# Patient Record
Sex: Female | Born: 2000 | Race: White | Hispanic: No | Marital: Single | Smoking: Never smoker
Health system: Southern US, Community
[De-identification: ages and names within clinical notes are randomized; demographics above are authoritative.]

## PROBLEM LIST (undated history)

## (undated) DIAGNOSIS — G43909 Migraine, unspecified, not intractable, without status migrainosus: Secondary | ICD-10-CM

## (undated) DIAGNOSIS — J45909 Unspecified asthma, uncomplicated: Secondary | ICD-10-CM

## (undated) HISTORY — PX: TYMPANOSTOMY TUBE PLACEMENT: SHX32

## (undated) HISTORY — DX: Unspecified asthma, uncomplicated: J45.909

---
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
## 2020-06-16 ENCOUNTER — Other Ambulatory Visit: Payer: Self-pay

## 2020-06-16 ENCOUNTER — Emergency Department
Admission: EM | Admit: 2020-06-16 | Discharge: 2020-06-16 | Discharge status: Against Medical Advice | Payer: Self-pay | Attending: Emergency Medicine | Admitting: Emergency Medicine

## 2020-06-16 DIAGNOSIS — R202 Paresthesia of skin: Secondary | ICD-10-CM | POA: Insufficient documentation

## 2020-06-16 DIAGNOSIS — R42 Dizziness and giddiness: Secondary | ICD-10-CM | POA: Insufficient documentation

## 2020-06-16 LAB — URINALYSIS, ROUTINE W REFLEX MICROSCOPIC
Bilirubin Urine: NEGATIVE
Glucose, UA: NEGATIVE mg/dL
Hgb urine dipstick: NEGATIVE
Ketones, ur: NEGATIVE mg/dL
Nitrite: POSITIVE — AB
Protein, ur: NEGATIVE mg/dL
Specific Gravity, Urine: 1.024 (ref 1.005–1.030)
pH: 5 (ref 5.0–8.0)

## 2020-06-16 LAB — PREGNANCY, URINE: Preg Test, Ur: NEGATIVE

## 2020-06-16 NOTE — ED Triage Notes (Signed)
Pt states that starting a couple days ago she begin getting a periodic "shock like sensation" behind her right ear, states that it will cause her to feel dizzy briefly when it happens, states that she has a hx of migraines but states that this has never happened before, pt states that currently she feels a migraine starting. Pt states periodically the tingling sensation would radiate into her right arm No distress noted at this time

## 2020-06-19 LAB — POCT PREGNANCY, URINE: Preg Test, Ur: NEGATIVE

## 2020-08-19 ENCOUNTER — Other Ambulatory Visit: Payer: Self-pay

## 2020-08-19 ENCOUNTER — Ambulatory Visit
Admission: EM | Admit: 2020-08-19 | Discharge: 2020-08-19 | Disposition: A | Discharge status: Home / Self Care | Payer: BLUE CROSS/BLUE SHIELD | Attending: Emergency Medicine | Admitting: Emergency Medicine

## 2020-08-19 ENCOUNTER — Encounter: Payer: Self-pay | Admitting: Emergency Medicine

## 2020-08-19 DIAGNOSIS — H1032 Unspecified acute conjunctivitis, left eye: Secondary | ICD-10-CM

## 2020-08-19 MED ORDER — OFLOXACIN 0.3 % OP SOLN: 1.0000 [drp] | Freq: Four times a day (QID) | OPHTHALMIC | 0 refills | Status: DC

## 2020-08-19 NOTE — Discharge Instructions (Signed)
Use eye drops as prescribed and to completion Dispose of old contacts and wear glasses until you have finished course of antibiotic eye drops Wash pillow cases, wash hands regularly with soap and water, avoid touching your face and eyes, wash door handles, light switches, remotes and other objects you frequently touch Return or follow up with PCP if symptoms persists such as fever, chills, redness, swelling, eye pain, painful eye movements, vision changes, etc... 

## 2020-08-19 NOTE — ED Provider Notes (Signed)
[facility]   [id number] 08/19/20 Arrival Time: 1510  CC: Red eye  SUBJECTIVE:  Barbara Patrick is a 19 y.o. female presented to the urgent care with a complaint of left eye redness and itching that started yesterday.  Denies a precipitating event, trauma, or close contacts with similar symptoms.  Has tried OTC eye drops without relief.  Denies any aggravating factors.  Denies similar symptoms in the past.  Denies fever, chills, nausea, vomiting, eye pain, painful eye movements, halos, discharge, itching, vision changes, double vision, FB sensation, periorbital erythema.     Denies contact lens use.    ROS: As per HPI.  All other pertinent ROS negative.     Past Medical History:  Diagnosis Date   Migraine    Past Surgical History:  Procedure Laterality Date   TYMPANOSTOMY TUBE PLACEMENT     Allergies  Allergen Reactions   Toradol [Ketorolac Tromethamine]    No current facility-administered medications on file prior to encounter.   Current Outpatient Medications on File Prior to Encounter  Medication Sig Dispense Refill   ibuprofen (ADVIL,MOTRIN) 600 MG tablet Take 1 tablet (600 mg total) by mouth every 6 (six) hours as needed. (Patient not taking: Reported on 03/26/2017) 30 tablet 0   LORazepam (ATIVAN) 1 MG tablet Take 0.5 tablets (0.5 mg total) by mouth at bedtime as needed for anxiety. 5 tablet 0   metoCLOPramide (REGLAN) 10 MG tablet Take 1 tablet (10 mg total) by mouth every 8 (eight) hours as needed for nausea or vomiting. 30 tablet 0   prochlorperazine (COMPAZINE) 10 MG tablet Take 1 tablet (10 mg total) by mouth every 6 (six) hours as needed for nausea. 20 tablet 0   SUMAtriptan (IMITREX) 100 MG tablet Take 50-100 mg by mouth daily as needed. TAKE 1/2 - 1 TABLET BY MOUTH MAY REPEAT AFTER 2 HOURS IF NEEDED NO MORE THEN 1 TAB PER 24 HOURS  6   topiramate (TOPAMAX) 25 MG tablet Take 25-100 mg by mouth daily. TAKE BY MOUTH AS DIRECTED. 1 TABLET X 3 DAYS, 2  TABLETS X 3 DAYS, 3 TABLETS X 3 DAYS, THEN 4 DAILY  2   Social History   Socioeconomic History   Marital status: Single    Spouse name: Not on file   Number of children: Not on file   Years of education: Not on file   Highest education level: Not on file  Occupational History   Not on file  Tobacco Use   Smoking status: Never Smoker   Smokeless tobacco: Never Used  Substance and Sexual Activity   Alcohol use: No   Drug use: Not on file   Sexual activity: Not on file  Other Topics Concern   Not on file  Social History Narrative   Not on file   Social Determinants of Health   Financial Resource Strain:    Difficulty of Paying Living Expenses: Not on file  Food Insecurity:    Worried About Running Out of Food in the Last Year: Not on file   Ran Out of Food in the Last Year: Not on file  Transportation Needs:    Lack of Transportation (Medical): Not on file   Lack of Transportation (Non-Medical): Not on file  Physical Activity:    Days of Exercise per Week: Not on file   Minutes of Exercise per Session: Not on file  Stress:    Feeling of Stress : Not on file  Social Connections:    Frequency of  Communication with Friends and Family: Not on file   Frequency of Social Gatherings with Friends and Family: Not on file   Attends Religious Services: Not on file   Active Member of Clubs or Organizations: Not on file   Attends Banker Meetings: Not on file   Marital Status: Not on file  Intimate Partner Violence:    Fear of Current or Ex-Partner: Not on file   Emotionally Abused: Not on file   Physically Abused: Not on file   Sexually Abused: Not on file   No family history on file.  OBJECTIVE:    Visual Acuity  Right Eye Distance:   Left Eye Distance:   Bilateral Distance:    Right Eye Near:   Left Eye Near:    Bilateral Near:      Vitals:   08/19/20 1539  BP: 122/75  Pulse: 81  Resp: 16  Temp: 98.2 F (36.8 C)    TempSrc: Oral  SpO2: 96%    Physical Exam Vitals and nursing note reviewed.  Constitutional:      General: She is not in acute distress.    Appearance: Normal appearance. She is normal weight. She is not ill-appearing, toxic-appearing or diaphoretic.  HENT:     Head: Normocephalic.  Eyes:     General: Lids are normal. Lids are everted, no foreign bodies appreciated. Vision grossly intact.        Right eye: No foreign body, discharge or hordeolum.        Left eye: Discharge present.No foreign body or hordeolum.     Comments: Left eye redness present  Cardiovascular:     Rate and Rhythm: Normal rate and regular rhythm.     Pulses: Normal pulses.     Heart sounds: Normal heart sounds. No murmur heard.  No friction rub. No gallop.   Pulmonary:     Effort: Pulmonary effort is normal. No respiratory distress.     Breath sounds: Normal breath sounds. No stridor. No wheezing, rhonchi or rales.  Chest:     Chest wall: No tenderness.  Neurological:     Mental Status: She is alert and oriented to person, place, and time.      ASSESSMENT & PLAN:  1. Acute bacterial conjunctivitis of left eye     Meds ordered this encounter  Medications   ofloxacin (OCUFLOX) 0.3 % ophthalmic solution    Sig: Place 1 drop into the left eye 4 (four) times daily.    Dispense:  5 mL    Refill:  0     Discharge Instructions Use eye drops as prescribed and to completion Dispose of old contacts and wear glasses until you have finished course of antibiotic eye drops Wash pillow cases, wash hands regularly with soap and water, avoid touching your face and eyes, wash door handles, light switches, remotes and other objects you frequently touch Return or follow up with PCP if symptoms persists such as fever, chills, redness, swelling, eye pain, painful eye movements, vision changes, etc...   Reviewed expectations re: course of current medical issues. Questions answered. Outlined signs and symptoms  indicating need for more acute intervention. Patient verbalized understanding. After Visit Summary given.   Note: This document was prepared using Dragon voice recognition software and may include unintentional dictation errors.    Durward Parcel, FNP 08/19/20 1621

## 2020-08-19 NOTE — ED Triage Notes (Signed)
left eye redness with itching. pt states she rubbed her eye last night and its been red and watery every since.

## 2023-09-26 ENCOUNTER — Other Ambulatory Visit: Payer: Self-pay | Admitting: Student

## 2023-09-26 DIAGNOSIS — R531 Weakness: Secondary | ICD-10-CM

## 2023-09-26 DIAGNOSIS — H53149 Visual discomfort, unspecified: Secondary | ICD-10-CM

## 2023-09-26 DIAGNOSIS — G43E09 Chronic migraine with aura, not intractable, without status migrainosus: Secondary | ICD-10-CM

## 2023-09-26 DIAGNOSIS — G8929 Other chronic pain: Secondary | ICD-10-CM

## 2023-09-30 ENCOUNTER — Other Ambulatory Visit: Payer: Self-pay | Admitting: Student

## 2023-09-30 DIAGNOSIS — R531 Weakness: Secondary | ICD-10-CM

## 2023-09-30 DIAGNOSIS — G8929 Other chronic pain: Secondary | ICD-10-CM

## 2023-10-16 ENCOUNTER — Ambulatory Visit
Admission: RE | Admit: 2023-10-16 | Discharge: 2023-10-16 | Disposition: A | Discharge status: Home / Self Care | Payer: Commercial Managed Care - PPO | Source: Ambulatory Visit | Attending: Student | Admitting: Student

## 2023-10-16 DIAGNOSIS — R531 Weakness: Secondary | ICD-10-CM

## 2023-10-16 DIAGNOSIS — G43E09 Chronic migraine with aura, not intractable, without status migrainosus: Secondary | ICD-10-CM

## 2023-10-16 DIAGNOSIS — H53149 Visual discomfort, unspecified: Secondary | ICD-10-CM

## 2023-10-16 DIAGNOSIS — G8929 Other chronic pain: Secondary | ICD-10-CM

## 2023-11-23 ENCOUNTER — Other Ambulatory Visit: Payer: Self-pay

## 2023-11-23 ENCOUNTER — Emergency Department
Admission: EM | Admit: 2023-11-23 | Discharge: 2023-11-23 | Disposition: A | Discharge status: Home / Self Care | Payer: Commercial Managed Care - PPO | Attending: Emergency Medicine | Admitting: Emergency Medicine

## 2023-11-23 ENCOUNTER — Emergency Department: Payer: Commercial Managed Care - PPO

## 2023-11-23 DIAGNOSIS — R519 Headache, unspecified: Secondary | ICD-10-CM | POA: Diagnosis present

## 2023-11-23 DIAGNOSIS — G43009 Migraine without aura, not intractable, without status migrainosus: Secondary | ICD-10-CM | POA: Insufficient documentation

## 2023-11-23 DIAGNOSIS — R079 Chest pain, unspecified: Secondary | ICD-10-CM | POA: Diagnosis not present

## 2023-11-23 LAB — TROPONIN I (HIGH SENSITIVITY): Troponin I (High Sensitivity): <2 ng/L (ref <18)

## 2023-11-23 LAB — BASIC METABOLIC PANEL
Anion gap: 8 (ref 5–15)
BUN: 11 mg/dL (ref 6–20)
CO2: 21 mmol/L — ABNORMAL LOW (ref 22–32)
Calcium: 8.3 mg/dL — ABNORMAL LOW (ref 8.9–10.3)
Chloride: 108 mmol/L (ref 98–111)
Creatinine, Ser: 0.7 mg/dL (ref 0.44–1.00)
GFR, Estimated: >60 mL/min (ref >60)
Glucose, Bld: 99 mg/dL (ref 70–99)
Potassium: 3.5 mmol/L (ref 3.5–5.1)
Sodium: 137 mmol/L (ref 135–145)

## 2023-11-23 LAB — CBC
HCT: 35.2 % — ABNORMAL LOW (ref 36.0–46.0)
Hemoglobin: 12.2 g/dL (ref 12.0–15.0)
MCH: 30.5 pg (ref 26.0–34.0)
MCHC: 34.7 g/dL (ref 30.0–36.0)
MCV: 88 fL (ref 80.0–100.0)
Platelets: 214 10*3/uL (ref 150–400)
RBC: 4 MIL/uL (ref 3.87–5.11)
RDW: 12.5 % (ref 11.5–15.5)
WBC: 6.1 10*3/uL (ref 4.0–10.5)
nRBC: 0 % (ref 0.0–0.2)

## 2023-11-23 MED ORDER — DIPHENHYDRAMINE HCL 50 MG/ML IJ SOLN
25.0000 mg | Freq: Once | INTRAMUSCULAR | Status: AC
  Administered 2023-11-23: 25 mg via INTRAVENOUS
  Filled 2023-11-23: qty 1

## 2023-11-23 MED ORDER — PROCHLORPERAZINE EDISYLATE 10 MG/2ML IJ SOLN
10.0000 mg | Freq: Once | INTRAMUSCULAR | Status: AC
  Administered 2023-11-23: 10 mg via INTRAVENOUS
  Filled 2023-11-23: qty 2

## 2023-11-23 MED ORDER — DEXAMETHASONE SODIUM PHOSPHATE 10 MG/ML IJ SOLN
10.0000 mg | Freq: Once | INTRAMUSCULAR | Status: AC
  Administered 2023-11-23: 10 mg via INTRAVENOUS
  Filled 2023-11-23: qty 1

## 2023-11-23 MED ORDER — SODIUM CHLORIDE 0.9 % IV BOLUS
500.0000 mL | Freq: Once | INTRAVENOUS | Status: AC
  Administered 2023-11-23: 500 mL via INTRAVENOUS

## 2023-11-23 NOTE — ED Triage Notes (Signed)
Pt to ED via POV from home. Pt reports was having a migraine and took medication 1hr PTA and is now having center/right sided chest tightness, nausea, SOB and right jaw pain. Pt reports has had the migraine medication before with no issues. Pt is also on propanolol.

## 2023-11-23 NOTE — Discharge Instructions (Addendum)
Your evaluated in the ED today for chest pain and a migraine.  The test performed today EKG, chest x-ray and blood work are all reassuring.  Please follow up with your primary neurologist to discuss Ubrelvy medication.

## 2023-11-23 NOTE — ED Provider Notes (Addendum)
[facility] Emergency Department Provider Note     Event Date/Time   First MD Initiated Contact with Patient 11/23/23 1453     (approximate)   History   Chest Pain   HPI  Barbara Patrick is a 22 y.o. female with a history of chronic migraines presents to the ED for evaluation of headache and centralized chest pain with radiation to her right arm following taking a new medication for her migraines, Ubrelvy, 1 hour prior to ED arrival.  Patient reports she has tried this medication prior to today and had no side effects.  Prescription prescribed by her neurologist.  Associated symptoms includes nausea, shortness of breath and jaw pain.  Patient reports she has never experienced similar symptoms before.     Physical Exam   Triage Vital Signs: ED Triage Vitals  Encounter Vitals Group     BP 11/23/23 1444 133/79     Systolic BP Percentile --      Diastolic BP Percentile --      Pulse Rate 11/23/23 1443 90     Resp 11/23/23 1443 20     Temp 11/23/23 1443 98.1 F (36.7 C)     Temp Source 11/23/23 1443 Oral     SpO2 11/23/23 1444 98 %     Weight --      Height --      Head Circumference --      Peak Flow --      Pain Score 11/23/23 1441 0     Pain Loc --      Pain Education --      Exclude from Growth Chart --     Most recent vital signs: Vitals:   11/23/23 1443 11/23/23 1444  BP:  133/79  Pulse: 90   Resp: 20   Temp: 98.1 F (36.7 C)   SpO2:  98%    General: Alert and oriented. INAD.    Head:  NCAT.  Eyes:  PERRLA. EOMI.  CV:  Good peripheral perfusion. RRR. No peripheral edema.  RESP:  Normal effort. LCTAB. No retractions.  No wheezing or rales or rhonchi. ABD:  No distention. Soft, Non tender.  MSK:   Full ROM in all joints. No swelling, deformity or tenderness.  NEURO: Cranial nerves II-XII intact. No focal deficits. Sensation and motor function intact. 5/5 muscle strength of UE & LE. Gait is steady.    ED Results / Procedures /  Treatments   Labs (all labs ordered are listed, but only abnormal results are displayed) Labs Reviewed  CBC - Abnormal; Notable for the following components:      Result Value   HCT 35.2 (*)    All other components within normal limits  BASIC METABOLIC PANEL - Abnormal; Notable for the following components:   CO2 21 (*)    Calcium 8.3 (*)    All other components within normal limits    EKG  Normal sinus rhythm with sinus arrhythmia.  Unchanged since last EKG.  RADIOLOGY  I personally viewed and evaluated these images as part of my medical decision making, as well as reviewing the written report by the radiologist.  ED Provider Interpretation: Chest x-ray is unremarkable.  DG Chest 2 View  Result Date: 11/23/2023 CLINICAL DATA:  Central chest pain and shortness of breath. EXAM: CHEST - 2 VIEW COMPARISON:  07/31/2016 FINDINGS: The heart size and mediastinal contours are within normal limits. Both lungs are clear. The visualized skeletal structures are unremarkable. IMPRESSION: Normal  exam. Electronically Signed   By: Danae Orleans M.D.   On: 11/23/2023 15:14    PROCEDURES:  Critical Care performed: No  Procedures   MEDICATIONS ORDERED IN ED: Medications  diphenhydrAMINE (BENADRYL) injection 25 mg (25 mg Intravenous Given 11/23/23 1534)  dexamethasone (DECADRON) injection 10 mg (10 mg Intravenous Given 11/23/23 1534)  prochlorperazine (COMPAZINE) injection 10 mg (10 mg Intravenous Given 11/23/23 1534)  sodium chloride 0.9 % bolus 500 mL (500 mLs Intravenous New Bag/Given 11/23/23 1534)     IMPRESSION / MDM / ASSESSMENT AND PLAN / ED COURSE  I reviewed the triage vital signs and the nursing notes.                               22 y.o. female presents to the emergency department for evaluation and treatment of migraine and chest pain. See HPI for further details.   Differential diagnosis includes, but is not limited to medication reaction, migraine, PE,  costochondritis  Patient's presentation is most consistent with acute complicated illness / injury requiring diagnostic workup.  Patient is alert and oriented.  She is hemodynamically stable and in no acute distress.  Physical exam findings does not reveal any unilateral leg swelling and normal lung exam and my suspicion for PE low  Wells criteria utilized."Low risk group: 1.3% chance of PE in an ED population."  No indication of PE, highly unlikely.  No further workup needed.  Migraine cocktail administered in the ED with Decadron, Benadryl and Compazine with 500 mL IV fluids.   Chart reviewed-office visit on 10/29 patient started on Ubrelvy as needed for headache rescue.  MRI of brain performed on 10/24 that is unremarkable.  Labs are reassuring.  EKG is normal.  Chest x-ray is normal.  Patient is encouraged to follow-up closely with her neurologist next week for evaluation of her new medication Ubrelvy.   The patient is in stable condition for discharge home. Encouraged close follow up with Dr. Malvin Johns Neurosurgery for further management. ED precautions discussed. All questions and concerns were addressed during this ED visit.     FINAL CLINICAL IMPRESSION(S) / ED DIAGNOSES   Final diagnoses:  Nonspecific chest pain  Migraine without aura and without status migrainosus, not intractable   Rx / DC Orders   ED Discharge Orders     None        Note:  This document was prepared using Dragon voice recognition software and may include unintentional dictation errors.    Kern Reap A, PA-C 11/23/23 1653    Romeo Apple, Paytience Bures A, PA-C 11/23/23 1655    Minna Antis, MD 11/23/23 [phone]

## 2024-01-02 ENCOUNTER — Inpatient Hospital Stay: Payer: Commercial Managed Care - PPO | Attending: Internal Medicine | Admitting: Internal Medicine

## 2024-01-02 ENCOUNTER — Encounter: Payer: Self-pay | Admitting: Internal Medicine

## 2024-01-02 ENCOUNTER — Inpatient Hospital Stay: Payer: Commercial Managed Care - PPO

## 2024-01-02 VITALS — BP 129/74 | HR 104 | Temp 97.4°F | Ht 67.0 in | Wt 172.2 lb

## 2024-01-02 DIAGNOSIS — M51369 Other intervertebral disc degeneration, lumbar region without mention of lumbar back pain or lower extremity pain: Secondary | ICD-10-CM | POA: Insufficient documentation

## 2024-01-02 DIAGNOSIS — R233 Spontaneous ecchymoses: Secondary | ICD-10-CM | POA: Diagnosis not present

## 2024-01-02 DIAGNOSIS — M4317 Spondylolisthesis, lumbosacral region: Secondary | ICD-10-CM | POA: Insufficient documentation

## 2024-01-02 DIAGNOSIS — G43909 Migraine, unspecified, not intractable, without status migrainosus: Secondary | ICD-10-CM | POA: Diagnosis not present

## 2024-01-02 DIAGNOSIS — Z79899 Other long term (current) drug therapy: Secondary | ICD-10-CM | POA: Insufficient documentation

## 2024-01-02 DIAGNOSIS — M47816 Spondylosis without myelopathy or radiculopathy, lumbar region: Secondary | ICD-10-CM | POA: Insufficient documentation

## 2024-01-02 DIAGNOSIS — R9389 Abnormal findings on diagnostic imaging of other specified body structures: Secondary | ICD-10-CM | POA: Insufficient documentation

## 2024-01-02 DIAGNOSIS — M48061 Spinal stenosis, lumbar region without neurogenic claudication: Secondary | ICD-10-CM | POA: Insufficient documentation

## 2024-01-02 DIAGNOSIS — G8929 Other chronic pain: Secondary | ICD-10-CM | POA: Insufficient documentation

## 2024-01-02 DIAGNOSIS — R937 Abnormal findings on diagnostic imaging of other parts of musculoskeletal system: Secondary | ICD-10-CM | POA: Insufficient documentation

## 2024-01-02 DIAGNOSIS — R519 Headache, unspecified: Secondary | ICD-10-CM | POA: Diagnosis not present

## 2024-01-02 DIAGNOSIS — M47812 Spondylosis without myelopathy or radiculopathy, cervical region: Secondary | ICD-10-CM | POA: Diagnosis not present

## 2024-01-02 DIAGNOSIS — Z8249 Family history of ischemic heart disease and other diseases of the circulatory system: Secondary | ICD-10-CM | POA: Insufficient documentation

## 2024-01-02 DIAGNOSIS — R5383 Other fatigue: Secondary | ICD-10-CM | POA: Insufficient documentation

## 2024-01-02 DIAGNOSIS — J45909 Unspecified asthma, uncomplicated: Secondary | ICD-10-CM | POA: Diagnosis not present

## 2024-01-02 LAB — CBC WITH DIFFERENTIAL (CANCER CENTER ONLY)
Abs Immature Granulocytes: 0.01 10*3/uL (ref 0.00–0.07)
Basophils Absolute: 0.1 10*3/uL (ref 0.0–0.1)
Basophils Relative: 1 %
Eosinophils Absolute: 0 10*3/uL (ref 0.0–0.5)
Eosinophils Relative: 1 %
HCT: 39.9 % (ref 36.0–46.0)
Hemoglobin: 13.6 g/dL (ref 12.0–15.0)
Immature Granulocytes: 0 %
Lymphocytes Relative: 20 %
Lymphs Abs: 1.3 10*3/uL (ref 0.7–4.0)
MCH: 30.6 pg (ref 26.0–34.0)
MCHC: 34.1 g/dL (ref 30.0–36.0)
MCV: 89.7 fL (ref 80.0–100.0)
Monocytes Absolute: 0.4 10*3/uL (ref 0.1–1.0)
Monocytes Relative: 7 %
Neutro Abs: 4.5 10*3/uL (ref 1.7–7.7)
Neutrophils Relative %: 71 %
Platelet Count: 265 10*3/uL (ref 150–400)
RBC: 4.45 MIL/uL (ref 3.87–5.11)
RDW: 12.4 % (ref 11.5–15.5)
WBC Count: 6.3 10*3/uL (ref 4.0–10.5)
nRBC: 0 % (ref 0.0–0.2)

## 2024-01-02 LAB — CMP (CANCER CENTER ONLY)
ALT: 16 U/L (ref 0–44)
AST: 14 U/L — ABNORMAL LOW (ref 15–41)
Albumin: 4.5 g/dL (ref 3.5–5.0)
Alkaline Phosphatase: 99 U/L (ref 38–126)
Anion gap: 8 (ref 5–15)
BUN: 16 mg/dL (ref 6–20)
CO2: 24 mmol/L (ref 22–32)
Calcium: 9.2 mg/dL (ref 8.9–10.3)
Chloride: 105 mmol/L (ref 98–111)
Creatinine: 0.92 mg/dL (ref 0.44–1.00)
GFR, Estimated: >60 mL/min (ref >60)
Glucose, Bld: 96 mg/dL (ref 70–99)
Potassium: 3.7 mmol/L (ref 3.5–5.1)
Sodium: 137 mmol/L (ref 135–145)
Total Bilirubin: 1 mg/dL (ref 0.0–1.2)
Total Protein: 7.9 g/dL (ref 6.5–8.1)

## 2024-01-02 LAB — C-REACTIVE PROTEIN: CRP: <0.5 mg/dL (ref <1.0)

## 2024-01-02 LAB — LACTATE DEHYDROGENASE: LDH: 107 U/L (ref 98–192)

## 2024-01-02 NOTE — Assessment & Plan Note (Addendum)
#   October 2024 -lumbosacral spine and cervical spine MRI -incidental finding of diffuse abnormal T1 hypointense marrow signal within the cervical and visualized upper thoracic spine. While this finding can reflect a marrow infiltrative process, the most common causes include chronic anemia, smoking and obesity.  # Clinically low concern for any malignancy.  However patient has nonspecific symptoms of fatigue.  Patient does not have anemia, not obese-also does not smoke to explain her findings. Today I would recommend repeating labs including cbc/cmp; LDH; CRP; PT/PTT.  Will also review imaging at tumor conference next week.   # Easy bruising: No medications.  Check PT PTT.  # Migraines [Dr.Potter]- defer to neurology.   Thank you Dr.Potter for allowing me to participate in the care of your pleasant patient. Please do not hesitate to contact me with questions or concerns in the interim.  # DISPOSITION: # labs today-  # follow up in 1 week/ next Friday at 2- MD; no labs- Dr.B  # I reviewed the blood work- with the patient in detail; also reviewed the imaging independently [as summarized above]; and with the patient in detail.

## 2024-01-02 NOTE — Progress Notes (Signed)
 [facility] CONSULT NOTE  Patient Care Team: Isidro Butler POUR, MD as PCP - General (Pediatrics) Rennie Cindy SAUNDERS, MD as Consulting Physician (Internal Medicine)  CHIEF COMPLAINTS/PURPOSE OF CONSULTATION: Abnormal imaging.  Oncology History   No history exists.    Narrative & Impression  CLINICAL DATA:  Provided history: Left-sided weakness. Chronic neck and back pain. Additional history provided by the scanning technologist: The patient reports left-sided weakness.   EXAM: MRI LUMBAR SPINE WITHOUT CONTRAST   TECHNIQUE: Multiplanar, multisequence MR imaging of the lumbar spine was performed. No intravenous contrast was administered.   COMPARISON:  Same-day MRI examinations of the brain and cervical spine.   FINDINGS: Segmentation: Transitional lumbosacral anatomy. For the purposes of this dictation, the transitional vertebra is designated S1. By this numbering scheme, there is a small disc space at S1-S2. Additionally, there is assimilation or an assimilation joint on the left at S1-S2.   Alignment:  4 mm L5-S1 grade 1 retrolisthesis.   Vertebrae: Lumbar vertebral body height is maintained. Diffuse abnormal T1 hypointense marrow signal within the lumbar spine, as well as visualized portions of the sacral and lower thoracic spine. No significant marrow edema or focal worrisome marrow lesion.   Conus medullaris and cauda equina: Conus extends to the L2 level. No signal abnormality identified within the visualized distal spinal cord.   Paraspinal and other soft tissues: No acute finding within included portions of the abdomen/retroperitoneum. No paraspinal mass or collection.   Disc levels:   Mild disc desiccation without significant disc height loss at L4-L5. Mild disc degeneration at L5-S1.   L1-L2: No significant disc herniation or stenosis.   L2-L3: No significant disc herniation or stenosis.   L3-L4: No significant disc herniation or  stenosis.   L4-L5: Facet hypertrophy. No significant disc herniation or stenosis.   L5-S1: 4 mm grade 1 retrolisthesis. Central posterior annular fissure. Disc bulge with mild endplate spurring. Facet hypertrophy. Mild right greater than left subarticular narrowing (with some crowding of the descending right S1 nerve root). Central canal patent. Mild-to-moderate left neural foraminal narrowing.   S1-S2: Transitional level. Mild facet arthrosis on the right. No significant disc herniation or stenosis.   IMPRESSION: 1. Transitional lumbosacral anatomy as described. Careful attention to spinal numbering is recommended prior to any intervention. 2. Lumbar spondylosis as outlined and with findings most notably as follows. 3. At L5-S1, there is 4 mm grade 1 retrolisthesis. Central posterior annular fissure. Disc bulge with mild endplate spurring. Facet hypertrophy. Mild right greater than left subarticular narrowing (with some crowding of the descending right S1 nerve root). Mild-to-moderate left neural foraminal narrowing. 4. Diffusely abnormal T1 hypointense marrow signal within the lumbar spine, as well as visualized portions of the sacral and lower thoracic spine. While this findings can reflect a marrow infiltrative process, the most common causes include chronic anemia, smoking and obesity.     Electronically Signed   By: Rockey Childs D.O.   On: 10/16/2023 20:31   MPRESSION: 1. Mild cervical spondylosis as outlined. No significant spinal canal or foraminal stenosis. 2. Diffuse abnormal T1 hypointense marrow signal within the cervical and visualized upper thoracic spine. While this finding can reflect a marrow infiltrative process, the most common causes include chronic anemia, smoking and obesity.  HISTORY OF PRESENTING ILLNESS: Patient ambulating-independently.  Alone.   Mckynleigh Mussell 23 y.o.  female with history of migraine has been referred to us  for further evaluation  of abnormal imaging.  Patient has a history of migraines x  5-7 years; however noted to have worsening symptoms of headache and also bilateral lower extremity weakness.  Patient had MRI which was abnormal-for bone marrow signals.  Patient also complains of intermittent bilateral swollen lymph nodes in the underarms.  Last episode in October 2024.  Complains of chronic fatigue.  Also complians of easy brusing. No nose bleeds/fum bleeds.     Review of Systems  Constitutional:  Positive for malaise/fatigue. Negative for chills, diaphoresis, fever and weight loss.  HENT:  Negative for nosebleeds and sore throat.   Eyes:  Negative for double vision.  Respiratory:  Negative for cough, hemoptysis, sputum production, shortness of breath and wheezing.   Cardiovascular:  Negative for chest pain, palpitations, orthopnea and leg swelling.  Gastrointestinal:  Negative for abdominal pain, blood in stool, constipation, diarrhea, heartburn, melena, nausea and vomiting.  Genitourinary:  Negative for dysuria, frequency and urgency.  Musculoskeletal:  Negative for back pain and joint pain.  Skin: Negative.  Negative for itching and rash.  Neurological:  Positive for headaches. Negative for dizziness, tingling, focal weakness and weakness.  Endo/Heme/Allergies:  Bruises/bleeds easily.  Psychiatric/Behavioral:  Negative for depression. The patient is not nervous/anxious and does not have insomnia.     MEDICAL HISTORY:  Past Medical History:  Diagnosis Date   Asthma    Migraine     SURGICAL HISTORY: Past Surgical History:  Procedure Laterality Date   TYMPANOSTOMY TUBE PLACEMENT      SOCIAL HISTORY: Social History   Socioeconomic History   Marital status: Single    Spouse name: Not on file   Number of children: Not on file   Years of education: Not on file   Highest education level: Not on file  Occupational History   Not on file  Tobacco Use   Smoking status: Never   Smokeless tobacco:  Never  Vaping Use   Vaping status: Never Used  Substance and Sexual Activity   Alcohol use: No   Drug use: Never   Sexual activity: Not on file  Other Topics Concern   Not on file  Social History Narrative   Not on file   Social Drivers of Health   Financial Resource Strain: Not on file  Food Insecurity: No Food Insecurity (01/02/2024)   Hunger Vital Sign    Worried About Running Out of Food in the Last Year: Never true    Ran Out of Food in the Last Year: Never true  Transportation Needs: No Transportation Needs (01/02/2024)   PRAPARE - Administrator, Civil Service (Medical): No    Lack of Transportation (Non-Medical): No  Physical Activity: Not on file  Stress: Not on file  Social Connections: Not on file  Intimate Partner Violence: Not At Risk (01/02/2024)   Humiliation, Afraid, Rape, and Kick questionnaire    Fear of Current or Ex-Partner: No    Emotionally Abused: No    Physically Abused: No    Sexually Abused: No    FAMILY HISTORY: Family History  Problem Relation Age of Onset   Hypertension Mother    Hypertension Father     ALLERGIES:  is allergic to toradol  [ketorolac  tromethamine ].  MEDICATIONS:  Current Outpatient Medications  Medication Sig Dispense Refill   ondansetron  (ZOFRAN -ODT) 4 MG disintegrating tablet Take 4 mg by mouth every 8 (eight) hours as needed.     propranolol ER (INDERAL LA) 60 MG 24 hr capsule Take 60 mg by mouth daily.     SUMAtriptan (IMITREX) 100 MG tablet Take 50-100  mg by mouth daily as needed. TAKE 1/2 - 1 TABLET BY MOUTH MAY REPEAT AFTER 2 HOURS IF NEEDED NO MORE THEN 1 TAB PER 24 HOURS  6   venlafaxine (EFFEXOR) 37.5 MG tablet Take 37.5 mg by mouth daily.     LORazepam  (ATIVAN ) 1 MG tablet Take 0.5 tablets (0.5 mg total) by mouth at bedtime as needed for anxiety. 5 tablet 0   metoCLOPramide  (REGLAN ) 10 MG tablet Take 1 tablet (10 mg total) by mouth every 8 (eight) hours as needed for nausea or vomiting. 30 tablet 0    norethindrone (MICRONOR) 0.35 MG tablet Take 1 tablet by mouth daily. (Patient not taking: Reported on 01/02/2024)     prochlorperazine  (COMPAZINE ) 10 MG tablet Take 1 tablet (10 mg total) by mouth every 6 (six) hours as needed for nausea. (Patient not taking: Reported on 01/02/2024) 20 tablet 0   topiramate  (TOPAMAX ) 25 MG tablet Take 25-100 mg by mouth daily. TAKE BY MOUTH AS DIRECTED. 1 TABLET X 3 DAYS, 2 TABLETS X 3 DAYS, 3 TABLETS X 3 DAYS, THEN 4 DAILY (Patient not taking: Reported on 01/02/2024)  2   No current facility-administered medications for this visit.    PHYSICAL EXAMINATION:  Vitals:   01/02/24 1102  BP: 129/74  Pulse: (!) 104  Temp: (!) 97.4 F (36.3 C)  SpO2: 99%   Filed Weights   01/02/24 1102  Weight: 172 lb 3.2 oz (78.1 kg)    Physical Exam Vitals and nursing note reviewed.  HENT:     Head: Normocephalic and atraumatic.     Mouth/Throat:     Pharynx: Oropharynx is clear.  Eyes:     Extraocular Movements: Extraocular movements intact.     Pupils: Pupils are equal, round, and reactive to light.  Cardiovascular:     Rate and Rhythm: Normal rate and regular rhythm.  Pulmonary:     Comments: Decreased breath sounds bilaterally.  Abdominal:     Palpations: Abdomen is soft.  Musculoskeletal:        General: Normal range of motion.     Cervical back: Normal range of motion.  Skin:    General: Skin is warm.  Neurological:     General: No focal deficit present.     Mental Status: She is alert and oriented to person, place, and time.  Psychiatric:        Behavior: Behavior normal.        Judgment: Judgment normal.     LABORATORY DATA:  I have reviewed the data as listed Lab Results  Component Value Date   WBC 6.1 11/23/2023   HGB 12.2 11/23/2023   HCT 35.2 (L) 11/23/2023   MCV 88.0 11/23/2023   PLT 214 11/23/2023   Recent Labs    11/23/23 1533  NA 137  K 3.5  CL 108  CO2 21*  GLUCOSE 99  BUN 11  CREATININE 0.70  CALCIUM 8.3*  GFRNONAA  >60    RADIOGRAPHIC STUDIES: I have personally reviewed the radiological images as listed and agreed with the findings in the report. No results found.   Abnormal finding of diagnostic imaging # October 2024 -lumbosacral spine and cervical spine MRI -incidental finding of diffuse abnormal T1 hypointense marrow signal within the cervical and visualized upper thoracic spine. While this finding can reflect a marrow infiltrative process, the most common causes include chronic anemia, smoking and obesity.  # Clinically low concern for any malignancy.  However patient has nonspecific symptoms of fatigue.  Patient  does not have anemia, not obese-also does not smoke to explain her findings. Today I would recommend repeating labs including cbc/cmp; LDH; CRP; PT/PTT.  Will also review imaging at tumor conference next week.   # Easy bruising: No medications.  Check PT PTT.  # Migraines [Dr.Potter]- defer to neurology.   Thank you Dr.Potter for allowing me to participate in the care of your pleasant patient. Please do not hesitate to contact me with questions or concerns in the interim.  # DISPOSITION: # labs today-  # follow up in 1 week/ next Friday at 2- MD; no labs- Dr.B  # I reviewed the blood work- with the patient in detail; also reviewed the imaging independently [as summarized above]; and with the patient in detail.    Above plan of care was discussed with patient/family in detail.  My contact information was given to the patient/family.       Cindy JONELLE Joe, MD 01/02/2024 11:57 AM

## 2024-01-02 NOTE — Progress Notes (Signed)
 C/o back pain, 3/10, no injury.  Fell in Nov. 2024, "legs came out from under" her.  No appetite, no supplements.

## 2024-01-08 ENCOUNTER — Inpatient Hospital Stay: Payer: Commercial Managed Care - PPO

## 2024-01-09 ENCOUNTER — Inpatient Hospital Stay (HOSPITAL_BASED_OUTPATIENT_CLINIC_OR_DEPARTMENT_OTHER): Payer: Commercial Managed Care - PPO | Admitting: Internal Medicine

## 2024-01-09 ENCOUNTER — Encounter: Payer: Self-pay | Admitting: Internal Medicine

## 2024-01-09 VITALS — BP 119/62 | HR 59 | Temp 97.9°F | Ht 67.0 in

## 2024-01-09 DIAGNOSIS — R9389 Abnormal findings on diagnostic imaging of other specified body structures: Secondary | ICD-10-CM | POA: Diagnosis not present

## 2024-01-09 DIAGNOSIS — R937 Abnormal findings on diagnostic imaging of other parts of musculoskeletal system: Secondary | ICD-10-CM | POA: Diagnosis not present

## 2024-01-09 NOTE — Assessment & Plan Note (Addendum)
#   October 2024 -lumbosacral spine and cervical spine MRI -incidental finding of "diffuse abnormal T1 hypointense marrow signal within the cervical and visualized upper thoracic spine.  As per radiology the finding can reflect a marrow infiltrative process, the most common causes include chronic anemia, smoking and obesity.   # Clinically low concern for any malignancy.  However patient has nonspecific symptoms of fatigue.  Patient does not have anemia, not obese-also does not smoke to explain her findings.  Also discussed with radiology and also tumor conference-given absence of any other underlying factors suggestive of malignancy, this is likely a benign process.  Not recommend any further workup or surveillance, but as per mother likely surveillance imaging with MRI with neurology.   # Migraines [Dr.Potter]- defer to neurology.   # DISPOSITION # follow up as needed- Dr.B

## 2024-01-09 NOTE — Progress Notes (Signed)
[facility] CONSULT NOTE  Patient Care Team: Jackelyn Poling, MD as PCP - General (Pediatrics) Earna Coder, MD as Consulting Physician (Internal Medicine)  CHIEF COMPLAINTS/PURPOSE OF CONSULTATION: Abnormal imaging.  Oncology History   No history exists.    Narrative & Impression  CLINICAL DATA:  Provided history: Left-sided weakness. Chronic neck and back pain. Additional history provided by the scanning technologist: The patient reports left-sided weakness.   EXAM: MRI LUMBAR SPINE WITHOUT CONTRAST   TECHNIQUE: Multiplanar, multisequence MR imaging of the lumbar spine was performed. No intravenous contrast was administered.   COMPARISON:  Same-day MRI examinations of the brain and cervical spine.   FINDINGS: Segmentation: Transitional lumbosacral anatomy. For the purposes of this dictation, the transitional vertebra is designated S1. By this numbering scheme, there is a small disc space at S1-S2. Additionally, there is assimilation or an assimilation joint on the left at S1-S2.   Alignment:  4 mm L5-S1 grade 1 retrolisthesis.   Vertebrae: Lumbar vertebral body height is maintained. Diffuse abnormal T1 hypointense marrow signal within the lumbar spine, as well as visualized portions of the sacral and lower thoracic spine. No significant marrow edema or focal worrisome marrow lesion.   Conus medullaris and cauda equina: Conus extends to the L2 level. No signal abnormality identified within the visualized distal spinal cord.   Paraspinal and other soft tissues: No acute finding within included portions of the abdomen/retroperitoneum. No paraspinal mass or collection.   Disc levels:   Mild disc desiccation without significant disc height loss at L4-L5. Mild disc degeneration at L5-S1.   L1-L2: No significant disc herniation or stenosis.   L2-L3: No significant disc herniation or stenosis.   L3-L4: No significant disc herniation or  stenosis.   L4-L5: Facet hypertrophy. No significant disc herniation or stenosis.   L5-S1: 4 mm grade 1 retrolisthesis. Central posterior annular fissure. Disc bulge with mild endplate spurring. Facet hypertrophy. Mild right greater than left subarticular narrowing (with some crowding of the descending right S1 nerve root). Central canal patent. Mild-to-moderate left neural foraminal narrowing.   S1-S2: Transitional level. Mild facet arthrosis on the right. No significant disc herniation or stenosis.   IMPRESSION: 1. Transitional lumbosacral anatomy as described. Careful attention to spinal numbering is recommended prior to any intervention. 2. Lumbar spondylosis as outlined and with findings most notably as follows. 3. At L5-S1, there is 4 mm grade 1 retrolisthesis. Central posterior annular fissure. Disc bulge with mild endplate spurring. Facet hypertrophy. Mild right greater than left subarticular narrowing (with some crowding of the descending right S1 nerve root). Mild-to-moderate left neural foraminal narrowing. 4. Diffusely abnormal T1 hypointense marrow signal within the lumbar spine, as well as visualized portions of the sacral and lower thoracic spine. While this findings can reflect a marrow infiltrative process, the most common causes include chronic anemia, smoking and obesity.     Electronically Signed   By: Jackey Loge D.O.   On: 10/16/2023 20:31   MPRESSION: 1. Mild cervical spondylosis as outlined. No significant spinal canal or foraminal stenosis. 2. Diffuse abnormal T1 hypointense marrow signal within the cervical and visualized upper thoracic spine. While this finding can reflect a marrow infiltrative process, the most common causes include chronic anemia, smoking and obesity.  HISTORY OF PRESENTING ILLNESS: Patient ambulating-independently. Accompanied by mother  Barbara Patrick 23 y.o.  female with history of migraine has been referred to [place] for further  evaluation of abnormal MRI imaging findings concerning for hematologic malignancy.  Patient  denies any symptoms. Complains of chronic fatigue.  Also complians of easy brusing. No nose bleeds/fum bleeds.    Review of Systems  Constitutional:  Positive for malaise/fatigue. Negative for chills, diaphoresis, fever and weight loss.  HENT:  Negative for nosebleeds and sore throat.   Eyes:  Negative for double vision.  Respiratory:  Negative for cough, hemoptysis, sputum production, shortness of breath and wheezing.   Cardiovascular:  Negative for chest pain, palpitations, orthopnea and leg swelling.  Gastrointestinal:  Negative for abdominal pain, blood in stool, constipation, diarrhea, heartburn, melena, nausea and vomiting.  Genitourinary:  Negative for dysuria, frequency and urgency.  Musculoskeletal:  Negative for back pain and joint pain.  Skin: Negative.  Negative for itching and rash.  Neurological:  Positive for headaches. Negative for dizziness, tingling, focal weakness and weakness.  Psychiatric/Behavioral:  Negative for depression. The patient is not nervous/anxious and does not have insomnia.     MEDICAL HISTORY:  Past Medical History:  Diagnosis Date   Asthma    Migraine     SURGICAL HISTORY: Past Surgical History:  Procedure Laterality Date   TYMPANOSTOMY TUBE PLACEMENT      SOCIAL HISTORY: Social History   Socioeconomic History   Marital status: Single    Spouse name: Not on file   Number of children: Not on file   Years of education: Not on file   Highest education level: Not on file  Occupational History   Not on file  Tobacco Use   Smoking status: Never   Smokeless tobacco: Never  Vaping Use   Vaping status: Never Used  Substance and Sexual Activity   Alcohol use: No   Drug use: Never   Sexual activity: Not on file  Other Topics Concern   Not on file  Social History Narrative   Not on file   Social Drivers of Health   Financial Resource Strain:  Not on file  Food Insecurity: No Food Insecurity (01/02/2024)   Hunger Vital Sign    Worried About Running Out of Food in the Last Year: Never true    Ran Out of Food in the Last Year: Never true  Transportation Needs: No Transportation Needs (01/02/2024)   PRAPARE - Administrator, Civil Service (Medical): No    Lack of Transportation (Non-Medical): No  Physical Activity: Not on file  Stress: Not on file  Social Connections: Not on file  Intimate Partner Violence: Not At Risk (01/02/2024)   Humiliation, Afraid, Rape, and Kick questionnaire    Fear of Current or Ex-Partner: No    Emotionally Abused: No    Physically Abused: No    Sexually Abused: No    FAMILY HISTORY: Family History  Problem Relation Age of Onset   Hypertension Mother    Hypertension Father     ALLERGIES:  is allergic to toradol [ketorolac tromethamine].  MEDICATIONS:  Current Outpatient Medications  Medication Sig Dispense Refill   LORazepam (ATIVAN) 1 MG tablet Take 0.5 tablets (0.5 mg total) by mouth at bedtime as needed for anxiety. 5 tablet 0   ondansetron (ZOFRAN-ODT) 4 MG disintegrating tablet Take 4 mg by mouth every 8 (eight) hours as needed.     propranolol ER (INDERAL LA) 60 MG 24 hr capsule Take 60 mg by mouth daily.     SUMAtriptan (IMITREX) 100 MG tablet Take 50-100 mg by mouth daily as needed. TAKE 1/2 - 1 TABLET BY MOUTH MAY REPEAT AFTER 2 HOURS IF NEEDED NO MORE THEN 1 TAB  PER 24 HOURS  6   venlafaxine (EFFEXOR) 37.5 MG tablet Take 37.5 mg by mouth daily.     metoCLOPramide (REGLAN) 10 MG tablet Take 1 tablet (10 mg total) by mouth every 8 (eight) hours as needed for nausea or vomiting. 30 tablet 0   norethindrone (MICRONOR) 0.35 MG tablet Take 1 tablet by mouth daily. (Patient not taking: Reported on 01/09/2024)     prochlorperazine (COMPAZINE) 10 MG tablet Take 1 tablet (10 mg total) by mouth every 6 (six) hours as needed for nausea. (Patient not taking: Reported on 01/09/2024) 20  tablet 0   topiramate (TOPAMAX) 25 MG tablet Take 25-100 mg by mouth daily. TAKE BY MOUTH AS DIRECTED. 1 TABLET X 3 DAYS, 2 TABLETS X 3 DAYS, 3 TABLETS X 3 DAYS, THEN 4 DAILY (Patient not taking: Reported on 01/02/2024)  2   No current facility-administered medications for this visit.    PHYSICAL EXAMINATION:  Vitals:   01/09/24 1350  BP: 119/62  Pulse: (!) 59  Temp: 97.9 F (36.6 C)  SpO2: 100%    There were no vitals filed for this visit.   Physical Exam Vitals and nursing note reviewed.  HENT:     Head: Normocephalic and atraumatic.     Mouth/Throat:     Pharynx: Oropharynx is clear.  Eyes:     Extraocular Movements: Extraocular movements intact.     Pupils: Pupils are equal, round, and reactive to light.  Cardiovascular:     Rate and Rhythm: Normal rate and regular rhythm.  Pulmonary:     Comments: Decreased breath sounds bilaterally.  Abdominal:     Palpations: Abdomen is soft.  Musculoskeletal:        General: Normal range of motion.     Cervical back: Normal range of motion.  Skin:    General: Skin is warm.  Neurological:     General: No focal deficit present.     Mental Status: She is alert and oriented to person, place, and time.  Psychiatric:        Behavior: Behavior normal.        Judgment: Judgment normal.     LABORATORY DATA:  I have reviewed the data as listed Lab Results  Component Value Date   WBC 6.3 01/02/2024   HGB 13.6 01/02/2024   HCT 39.9 01/02/2024   MCV 89.7 01/02/2024   PLT 265 01/02/2024   Recent Labs    11/23/23 1533 01/02/24 1145  NA 137 137  K 3.5 3.7  CL 108 105  CO2 21* 24  GLUCOSE 99 96  BUN 11 16  CREATININE 0.70 0.92  CALCIUM 8.3* 9.2  GFRNONAA >60 >60  PROT  --  7.9  ALBUMIN  --  4.5  AST  --  14*  ALT  --  16  ALKPHOS  --  99  BILITOT  --  1.0    RADIOGRAPHIC STUDIES: I have personally reviewed the radiological images as listed and agreed with the findings in the report. No results  found.   Abnormal finding of diagnostic imaging # October 2024 -lumbosacral spine and cervical spine MRI -incidental finding of "diffuse abnormal T1 hypointense marrow signal within the cervical and visualized upper thoracic spine.  As per radiology the finding can reflect a marrow infiltrative process, the most common causes include chronic anemia, smoking and obesity.   # Clinically low concern for any malignancy.  However patient has nonspecific symptoms of fatigue.  Patient does not have anemia, not obese-also  does not smoke to explain her findings.  Also discussed with radiology and also tumor conference-given absence of any other underlying factors suggestive of malignancy, this is likely a benign process.  Not recommend any further workup or surveillance, but as per mother likely surveillance imaging with MRI with neurology.   # Migraines [Dr.Potter]- defer to neurology.   # DISPOSITION # follow up as needed- Dr.B   Above plan of care was discussed with patient/family in detail.  My contact information was given to the patient/family.       Earna Coder, MD 01/09/2024 2:12 PM

## 2024-01-09 NOTE — Progress Notes (Signed)
Appetite 25% normal, no supplements.

## 2024-03-04 ENCOUNTER — Other Ambulatory Visit: Payer: Self-pay | Admitting: Neurology

## 2024-03-04 DIAGNOSIS — R531 Weakness: Secondary | ICD-10-CM

## 2024-03-04 DIAGNOSIS — M545 Low back pain, unspecified: Secondary | ICD-10-CM

## 2024-03-04 DIAGNOSIS — G8929 Other chronic pain: Secondary | ICD-10-CM

## 2024-03-16 ENCOUNTER — Other Ambulatory Visit: Payer: Self-pay | Admitting: Internal Medicine

## 2024-03-16 ENCOUNTER — Ambulatory Visit
Admission: RE | Admit: 2024-03-16 | Discharge: 2024-03-16 | Disposition: A | Discharge status: Home / Self Care | Source: Ambulatory Visit | Attending: Internal Medicine | Admitting: Internal Medicine

## 2024-03-16 DIAGNOSIS — Z87828 Personal history of other (healed) physical injury and trauma: Secondary | ICD-10-CM | POA: Diagnosis present

## 2024-03-16 DIAGNOSIS — R310 Gross hematuria: Secondary | ICD-10-CM | POA: Diagnosis present

## 2024-03-16 DIAGNOSIS — R1084 Generalized abdominal pain: Secondary | ICD-10-CM

## 2024-03-23 ENCOUNTER — Emergency Department

## 2024-03-23 ENCOUNTER — Other Ambulatory Visit: Payer: Self-pay

## 2024-03-23 DIAGNOSIS — Z5321 Procedure and treatment not carried out due to patient leaving prior to being seen by health care provider: Secondary | ICD-10-CM | POA: Diagnosis not present

## 2024-03-23 DIAGNOSIS — R519 Headache, unspecified: Secondary | ICD-10-CM | POA: Diagnosis present

## 2024-03-23 DIAGNOSIS — G43909 Migraine, unspecified, not intractable, without status migrainosus: Secondary | ICD-10-CM | POA: Diagnosis not present

## 2024-03-23 LAB — BASIC METABOLIC PANEL WITH GFR
Anion gap: 10 (ref 5–15)
BUN: 14 mg/dL (ref 6–20)
CO2: 25 mmol/L (ref 22–32)
Calcium: 9.2 mg/dL (ref 8.9–10.3)
Chloride: 100 mmol/L (ref 98–111)
Creatinine, Ser: 0.82 mg/dL (ref 0.44–1.00)
GFR, Estimated: >60 mL/min (ref >60)
Glucose, Bld: 110 mg/dL — ABNORMAL HIGH (ref 70–99)
Potassium: 4.2 mmol/L (ref 3.5–5.1)
Sodium: 135 mmol/L (ref 135–145)

## 2024-03-23 LAB — CBC
HCT: 40.5 % (ref 36.0–46.0)
Hemoglobin: 14 g/dL (ref 12.0–15.0)
MCH: 31 pg (ref 26.0–34.0)
MCHC: 34.6 g/dL (ref 30.0–36.0)
MCV: 89.8 fL (ref 80.0–100.0)
Platelets: 233 10*3/uL (ref 150–400)
RBC: 4.51 MIL/uL (ref 3.87–5.11)
RDW: 12.4 % (ref 11.5–15.5)
WBC: 7.1 10*3/uL (ref 4.0–10.5)
nRBC: 0 % (ref 0.0–0.2)

## 2024-03-23 NOTE — ED Triage Notes (Signed)
 Pt to ED via POV c/o migraine.pt reports it started around 8pm tonight. Headache accompanied by right sided facial numbness and right sided blurry vision. LKW about 8pm today. Pt has hx of migraines. NIH 1. PT a&oX4, ambulatory, no facial droop, no extremity drift

## 2024-03-24 ENCOUNTER — Emergency Department
Admission: EM | Admit: 2024-03-24 | Discharge: 2024-03-24 | Discharge status: Against Medical Advice | Attending: Emergency Medicine | Admitting: Emergency Medicine

## 2024-03-24 LAB — HCG, QUANTITATIVE, PREGNANCY: hCG, Beta Chain, Quant, S: <1 m[IU]/mL (ref <5)
